# Patient Record
Sex: Female | Born: 1995
Health system: Southern US, Community
[De-identification: ages and names within clinical notes are randomized; demographics above are authoritative.]

## PROBLEM LIST (undated history)

## (undated) DIAGNOSIS — F329 Major depressive disorder, single episode, unspecified: Secondary | ICD-10-CM

## (undated) DIAGNOSIS — F419 Anxiety disorder, unspecified: Secondary | ICD-10-CM

## (undated) DIAGNOSIS — G43909 Migraine, unspecified, not intractable, without status migrainosus: Secondary | ICD-10-CM

## (undated) DIAGNOSIS — F909 Attention-deficit hyperactivity disorder, unspecified type: Secondary | ICD-10-CM

## (undated) DIAGNOSIS — F32A Depression, unspecified: Secondary | ICD-10-CM

## (undated) HISTORY — DX: Attention-deficit hyperactivity disorder, unspecified type: F90.9

## (undated) HISTORY — DX: Depression, unspecified: F32.A

## (undated) HISTORY — DX: Anxiety disorder, unspecified: F41.9

## (undated) HISTORY — DX: Migraine, unspecified, not intractable, without status migrainosus: G43.909

## (undated) HISTORY — DX: Major depressive disorder, single episode, unspecified: F32.9

---
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
## 2015-10-29 ENCOUNTER — Ambulatory Visit: Payer: No Typology Code available for payment source | Admitting: Adult Health

## 2015-12-13 ENCOUNTER — Encounter: Payer: Self-pay | Admitting: Adult Health

## 2015-12-13 ENCOUNTER — Ambulatory Visit (INDEPENDENT_AMBULATORY_CARE_PROVIDER_SITE_OTHER): Payer: 59 | Admitting: Adult Health

## 2015-12-13 VITALS — BP 130/90 | HR 76 | Temp 98.6°F | Ht 63.98 in | Wt 150.1 lb

## 2015-12-13 DIAGNOSIS — F329 Major depressive disorder, single episode, unspecified: Secondary | ICD-10-CM | POA: Diagnosis not present

## 2015-12-13 DIAGNOSIS — G43909 Migraine, unspecified, not intractable, without status migrainosus: Secondary | ICD-10-CM | POA: Insufficient documentation

## 2015-12-13 DIAGNOSIS — Z23 Encounter for immunization: Secondary | ICD-10-CM | POA: Diagnosis not present

## 2015-12-13 DIAGNOSIS — F32A Depression, unspecified: Secondary | ICD-10-CM

## 2015-12-13 DIAGNOSIS — Z7189 Other specified counseling: Secondary | ICD-10-CM | POA: Diagnosis not present

## 2015-12-13 DIAGNOSIS — H6123 Impacted cerumen, bilateral: Secondary | ICD-10-CM

## 2015-12-13 DIAGNOSIS — R030 Elevated blood-pressure reading, without diagnosis of hypertension: Secondary | ICD-10-CM

## 2015-12-13 DIAGNOSIS — Z7689 Persons encountering health services in other specified circumstances: Secondary | ICD-10-CM

## 2015-12-13 DIAGNOSIS — IMO0001 Reserved for inherently not codable concepts without codable children: Secondary | ICD-10-CM | POA: Insufficient documentation

## 2015-12-13 MED ORDER — CITALOPRAM HYDROBROMIDE 20 MG PO TABS: 20.0000 mg | ORAL_TABLET | Freq: Every day | ORAL | Status: DC

## 2015-12-13 NOTE — Patient Instructions (Addendum)
It was great meeting you today!  I have sent in a prescription for Celexa 20 mg. Take this daily for depression.   Make an appointment with GYN   Follow up with me in 4-6 weeks.   Please let me know if you need anything.   Health Maintenance, Female Adopting a healthy lifestyle and getting preventive care can go a long way to promote health and wellness. Talk with your health care provider about what schedule of regular examinations is right for you. This is a good chance for you to check in with your provider about disease prevention and staying healthy. In between checkups, there are plenty of things you can do on your own. Experts have done a lot of research about which lifestyle changes and preventive measures are most likely to keep you healthy. Ask your health care provider for more information. WEIGHT AND DIET  Eat a healthy diet  Be sure to include plenty of vegetables, fruits, low-fat dairy products, and lean protein.  Do not eat a lot of foods high in solid fats, added sugars, or salt.  Get regular exercise. This is one of the most important things you can do for your health.  Most adults should exercise for at least 150 minutes each week. The exercise should increase your heart rate and make you sweat (moderate-intensity exercise).  Most adults should also do strengthening exercises at least twice a week. This is in addition to the moderate-intensity exercise.  Maintain a healthy weight  Body mass index (BMI) is a measurement that can be used to identify possible weight problems. It estimates body fat based on height and weight. Your health care provider can help determine your BMI and help you achieve or maintain a healthy weight.  For females 28 years of age and older:   A BMI below 18.5 is considered underweight.  A BMI of 18.5 to 24.9 is normal.  A BMI of 25 to 29.9 is considered overweight.  A BMI of 30 and above is considered obese.  Watch levels of  cholesterol and blood lipids  You should start having your blood tested for lipids and cholesterol at 20 years of age, then have this test every 5 years.  You may need to have your cholesterol levels checked more often if:  Your lipid or cholesterol levels are high.  You are older than 20 years of age.  You are at high risk for heart disease.  CANCER SCREENING   Lung Cancer  Lung cancer screening is recommended for adults 44-30 years old who are at high risk for lung cancer because of a history of smoking.  A yearly low-dose CT scan of the lungs is recommended for people who:  Currently smoke.  Have quit within the past 15 years.  Have at least a 30-pack-year history of smoking. A pack year is smoking an average of one pack of cigarettes a day for 1 year.  Yearly screening should continue until it has been 15 years since you quit.  Yearly screening should stop if you develop a health problem that would prevent you from having lung cancer treatment.  Breast Cancer  Practice breast self-awareness. This means understanding how your breasts normally appear and feel.  It also means doing regular breast self-exams. Let your health care provider know about any changes, no matter how small.  If you are in your 20s or 30s, you should have a clinical breast exam (CBE) by a health care provider every 1-3 years  as part of a regular health exam.  If you are 59 or older, have a CBE every year. Also consider having a breast X-ray (mammogram) every year.  If you have a family history of breast cancer, talk to your health care provider about genetic screening.  If you are at high risk for breast cancer, talk to your health care provider about having an MRI and a mammogram every year.  Breast cancer gene (BRCA) assessment is recommended for women who have family members with BRCA-related cancers. BRCA-related cancers include:  Breast.  Ovarian.  Tubal.  Peritoneal  cancers.  Results of the assessment will determine the need for genetic counseling and BRCA1 and BRCA2 testing. Cervical Cancer Your health care provider may recommend that you be screened regularly for cancer of the pelvic organs (ovaries, uterus, and vagina). This screening involves a pelvic examination, including checking for microscopic changes to the surface of your cervix (Pap test). You may be encouraged to have this screening done every 3 years, beginning at age 41.  For women ages 13-65, health care providers may recommend pelvic exams and Pap testing every 3 years, or they may recommend the Pap and pelvic exam, combined with testing for human papilloma virus (HPV), every 5 years. Some types of HPV increase your risk of cervical cancer. Testing for HPV may also be done on women of any age with unclear Pap test results.  Other health care providers may not recommend any screening for nonpregnant women who are considered low risk for pelvic cancer and who do not have symptoms. Ask your health care provider if a screening pelvic exam is right for you.  If you have had past treatment for cervical cancer or a condition that could lead to cancer, you need Pap tests and screening for cancer for at least 20 years after your treatment. If Pap tests have been discontinued, your risk factors (such as having a new sexual partner) need to be reassessed to determine if screening should resume. Some women have medical problems that increase the chance of getting cervical cancer. In these cases, your health care provider may recommend more frequent screening and Pap tests. Colorectal Cancer  This type of cancer can be detected and often prevented.  Routine colorectal cancer screening usually begins at 20 years of age and continues through 20 years of age.  Your health care provider may recommend screening at an earlier age if you have risk factors for colon cancer.  Your health care provider may also  recommend using home test kits to check for hidden blood in the stool.  A small camera at the end of a tube can be used to examine your colon directly (sigmoidoscopy or colonoscopy). This is done to check for the earliest forms of colorectal cancer.  Routine screening usually begins at age 9.  Direct examination of the colon should be repeated every 5-10 years through 20 years of age. However, you may need to be screened more often if early forms of precancerous polyps or small growths are found. Skin Cancer  Check your skin from head to toe regularly.  Tell your health care provider about any new moles or changes in moles, especially if there is a change in a mole's shape or color.  Also tell your health care provider if you have a mole that is larger than the size of a pencil eraser.  Always use sunscreen. Apply sunscreen liberally and repeatedly throughout the [place].  Protect yourself by wearing long sleeves,  pants, a wide-brimmed hat, and sunglasses whenever you are outside. HEART DISEASE, DIABETES, AND HIGH BLOOD PRESSURE   High blood pressure causes heart disease and increases the risk of stroke. High blood pressure is more likely to develop in:  People who have blood pressure in the high end of the normal range (130-139/85-89 mm Hg).  People who are overweight or obese.  People who are African American.  If you are 22-60 years of age, have your blood pressure checked every 3-5 years. If you are 61 years of age or older, have your blood pressure checked every year. You should have your blood pressure measured twice--once when you are at a hospital or clinic, and once when you are not at a hospital or clinic. Record the average of the two measurements. To check your blood pressure when you are not at a hospital or clinic, you can use:  An automated blood pressure machine at a pharmacy.  A home blood pressure monitor.  If you are between 29 years and 55 years old, ask your health  care provider if you should take aspirin to prevent strokes.  Have regular diabetes screenings. This involves taking a blood sample to check your fasting blood sugar level.  If you are at a normal weight and have a low risk for diabetes, have this test once every three years after 20 years of age.  If you are overweight and have a high risk for diabetes, consider being tested at a younger age or more often. PREVENTING INFECTION  Hepatitis B  If you have a higher risk for hepatitis B, you should be screened for this virus. You are considered at high risk for hepatitis B if:  You were born in a country where hepatitis B is common. Ask your health care provider which countries are considered high risk.  Your parents were born in a high-risk country, and you have not been immunized against hepatitis B (hepatitis B vaccine).  You have HIV or AIDS.  You use needles to inject street drugs.  You live with someone who has hepatitis B.  You have had sex with someone who has hepatitis B.  You get hemodialysis treatment.  You take certain medicines for conditions, including cancer, organ transplantation, and autoimmune conditions. Hepatitis C  Blood testing is recommended for:  Everyone born from [birth year] through 1965.  Anyone with known risk factors for hepatitis C. Sexually transmitted infections (STIs)  You should be screened for sexually transmitted infections (STIs) including gonorrhea and chlamydia if:  You are sexually active and are younger than 20 years of age.  You are older than 20 years of age and your health care provider tells you that you are at risk for this type of infection.  Your sexual activity has changed since you were last screened and you are at an increased risk for chlamydia or gonorrhea. Ask your health care provider if you are at risk.  If you do not have HIV, but are at risk, it may be recommended that you take a prescription medicine daily to prevent HIV  infection. This is called pre-exposure prophylaxis (PrEP). You are considered at risk if:  You are sexually active and do not regularly use condoms or know the HIV status of your partner(s).  You take drugs by injection.  You are sexually active with a partner who has HIV. Talk with your health care provider about whether you are at high risk of being infected with HIV. If you choose to  begin PrEP, you should first be tested for HIV. You should then be tested every 3 months for as long as you are taking PrEP.  PREGNANCY   If you are premenopausal and you may become pregnant, ask your health care provider about preconception counseling.  If you may become pregnant, take 400 to 800 micrograms (mcg) of folic acid every day.  If you want to prevent pregnancy, talk to your health care provider about birth control (contraception). OSTEOPOROSIS AND MENOPAUSE   Osteoporosis is a disease in which the bones lose minerals and strength with aging. This can result in serious bone fractures. Your risk for osteoporosis can be identified using a bone density scan.  If you are 10 years of age or older, or if you are at risk for osteoporosis and fractures, ask your health care provider if you should be screened.  Ask your health care provider whether you should take a calcium or vitamin D supplement to lower your risk for osteoporosis.  Menopause may have certain physical symptoms and risks.  Hormone replacement therapy may reduce some of these symptoms and risks. Talk to your health care provider about whether hormone replacement therapy is right for you.  HOME CARE INSTRUCTIONS   Schedule regular health, dental, and eye exams.  Stay current with your immunizations.   Do not use any tobacco products including cigarettes, chewing tobacco, or electronic cigarettes.  If you are pregnant, do not drink alcohol.  If you are breastfeeding, limit how much and how often you drink alcohol.  Limit  alcohol intake to no more than 1 drink per day for nonpregnant women. One drink equals 12 ounces of beer, 5 ounces of wine, or 1 ounces of hard liquor.  Do not use street drugs.  Do not share needles.  Ask your health care provider for help if you need support or information about quitting drugs.  Tell your health care provider if you often feel depressed.  Tell your health care provider if you have ever been abused or do not feel safe at home.   This information is not intended to replace advice given to you by your health care provider. Make sure you discuss any questions you have with your health care provider.   Document Released: 05/04/2011 Document Revised: 11/09/2014 Document Reviewed: 09/20/2013 Elsevier Interactive Patient Education [employer].

## 2015-12-13 NOTE — Progress Notes (Signed)
Pre visit review using our clinic review tool, if applicable. No additional management support is needed unless otherwise documented below in the visit note. 

## 2015-12-13 NOTE — Progress Notes (Signed)
Patient presents to clinic today to establish care. She is a very pleasant 20 year old AA female. She  has a past medical history of Depression; Migraines; and ADHD (attention deficit hyperactivity disorder).   Acute Concerns: - Decreased sexual sensation. She is sexually active and not on birth control. She reports that recently she has noticed that when she has sex, she has no sensation besides penetration.   Chronic Issues:  Depression - This has been a chronic issue for many years. She feels as though when her parents were seperated in 2015 that her depression became worse. She has had thoughts of hurting  Herself in the past but has never acted on it and does not have a plan. She has never taken medication for depression.   Migraine - Has two migraines a week on average. Takes ibuprofen which sometimes helps take care of the headache and other times the headache just comes back.   Health Maintenance: Dental -- Twice a year  Vision -- Early Immunizations --UTD PAP -- Never had Diet - Does not eat healthy Exercise - Does not exercise.   Past Medical History  Diagnosis Date  . Depression   . Frequent headaches   . Migraines     No past surgical history on file.  No current outpatient prescriptions on file prior to visit.   No current facility-administered medications on file prior to visit.    Allergies  Allergen Reactions  . Penicillins Hives    No family history on file.  Social History   Social History  . Marital Status: Single    Spouse Name: N/A  . Number of Children: N/A  . Years of Education: N/A   Occupational History  . Not on file.   Social History Main Topics  . Smoking status: Current Every Day Smoker    Start date: 10/11/2014  . Smokeless tobacco: Not on file  . Alcohol Use: 0.6 - 1.8 oz/week    1-3 Standard drinks or equivalent per week  . Drug Use: No  . Sexual Activity: No   Other Topics Concern  . Not on file   Social History  Narrative    Review of Systems  Constitutional: Negative.   Eyes: Negative.   Respiratory: Negative.   Cardiovascular: Negative.   Genitourinary: Negative.   Musculoskeletal: Negative.   Skin: Negative.   Neurological: Positive for headaches.  Endo/Heme/Allergies: Negative.   Psychiatric/Behavioral: Positive for depression. Negative for suicidal ideas and substance abuse. The patient does not have insomnia.   All other systems reviewed and are negative.   BP 130/90 mmHg  Pulse 76  Temp(Src) 98.6 F (37 C) (Oral)  Ht 5' 3.98" (1.625 m)  Wt 150 lb 1.6 oz (68.085 kg)  BMI 25.78 kg/m2  SpO2 99%  Physical Exam  Constitutional: She is oriented to person, place, and time and well-developed, well-nourished, and in no distress. No distress.  HENT:  Head: Normocephalic and atraumatic.  Right Ear: External ear normal.  Left Ear: External ear normal.  Nose: Nose normal.  Mouth/Throat: Oropharynx is clear and moist. No oropharyngeal exudate.  Cerumen impaction in bilateral ears  Eyes: Conjunctivae and EOM are normal. Pupils are equal, round, and reactive to light. Right eye exhibits no discharge. Left eye exhibits no discharge. No scleral icterus.  Neck: Normal range of motion. Neck supple. No tracheal deviation present. No thyromegaly present.  Cardiovascular: Normal rate, regular rhythm, normal heart sounds and intact distal pulses.  Exam reveals no gallop  and no friction rub.   No murmur heard. Pulmonary/Chest: Effort normal and breath sounds normal. No stridor. No respiratory distress. She has no wheezes. She has no rales. She exhibits no tenderness.  Abdominal: Soft. Bowel sounds are normal. She exhibits no distension and no mass. There is no tenderness. There is no rebound and no guarding.  Musculoskeletal: Normal range of motion. She exhibits no edema or tenderness.  Lymphadenopathy:    She has no cervical adenopathy.  Neurological: She is alert and oriented to person, place,  and time. She displays normal reflexes. No cranial nerve deficit. She exhibits normal muscle tone. Gait normal. Coordination normal. GCS score is 15.  Skin: Skin is warm and dry. No rash noted. She is not diaphoretic. No erythema. No pallor.  Psychiatric: Mood, memory, affect and judgment normal.  Nursing note and vitals reviewed.   No results found for this or any previous visit (from the past 2160 hour(s)).  Assessment/Plan: 1. Encounter to establish care - Follow up for physical  - Follow up sooner if needed - She needs to start exercising and eating healthy - Will continue to monitor blood pressure - She will follow up with GYN  2. Needs flu shot - Flu Vaccine QUAD 36+ mos PF IM (Fluarix & Fluzone Quad PF)  3. Depression - Advised to go to the ER with any thoughts of SI or self harm - citalopram (CELEXA) 20 MG tablet; Take 1 tablet (20 mg total) by mouth daily.  Dispense: 30 tablet; Refill: 3 - Follow up in 4-6 weeks.   4. Cerumen impaction, bilateral - Cerumen able to be disimpacted with irrigation. No signs of infection.

## 2016-03-20 ENCOUNTER — Encounter: Payer: Self-pay | Admitting: Certified Nurse Midwife

## 2016-03-26 ENCOUNTER — Encounter: Payer: Self-pay | Admitting: Certified Nurse Midwife

## 2016-03-26 ENCOUNTER — Ambulatory Visit (INDEPENDENT_AMBULATORY_CARE_PROVIDER_SITE_OTHER): Payer: 59 | Admitting: Certified Nurse Midwife

## 2016-03-26 VITALS — BP 110/68 | HR 72 | Resp 16 | Ht 64.75 in | Wt 144.0 lb

## 2016-03-26 DIAGNOSIS — Z0000 Encounter for general adult medical examination without abnormal findings: Secondary | ICD-10-CM

## 2016-03-26 DIAGNOSIS — Z01419 Encounter for gynecological examination (general) (routine) without abnormal findings: Secondary | ICD-10-CM

## 2016-03-26 DIAGNOSIS — N898 Other specified noninflammatory disorders of vagina: Secondary | ICD-10-CM

## 2016-03-26 DIAGNOSIS — Z30011 Encounter for initial prescription of contraceptive pills: Secondary | ICD-10-CM

## 2016-03-26 LAB — POCT URINALYSIS DIPSTICK
BILIRUBIN UA: NEGATIVE
GLUCOSE UA: NEGATIVE
KETONES UA: NEGATIVE
Leukocytes, UA: NEGATIVE
NITRITE UA: NEGATIVE
PH UA: 5
Protein, UA: NEGATIVE
RBC UA: NEGATIVE
Urobilinogen, UA: NEGATIVE

## 2016-03-26 MED ORDER — NORETHIN ACE-ETH ESTRAD-FE 1-20 MG-MCG(24) PO TABS: 1.0000 | ORAL_TABLET | Freq: Every day | ORAL | Status: DC

## 2016-03-26 NOTE — Progress Notes (Signed)
20 y.o. G0P0000 Single  African American Fe here to establish gyn care and for annual exam. Partner with patient. Patient gave verbal consent for conversation and management. Patient does not want partner in for exam.  Periods regular, with 7 day duration, minimal cramping, heavy day 1-2 only. No problems with periods. Patient concerned she can not feel penetration or obtain orgasm. Please check to see if problem. Desires STD screening. Patient also wants to start on OCP. No other health issues today.  Patient's last menstrual period was 03/11/2016.          Sexually active: Yes.    The current method of family planning is none.    Exercising: No.  exercise Smoker:  no  Health Maintenance: Pap:  none MMG:  none Colonoscopy:  none BMD:   none TDaP: maybe age 20 Shingles: no Pneumonia: no Hep C and HIV: not done Labs: poct urine-neg, hgb-13.0 Self breast exam: not done   reports that she has been smoking E-cigarettes.  She started smoking about 17 months ago. She does not have any smokeless tobacco history on file. She reports that she drinks alcohol. She reports that she does not use illicit drugs.  Past Medical History  Diagnosis Date  . Depression   . Migraines   . ADHD (attention deficit hyperactivity disorder)   . Anxiety     History reviewed. No pertinent past surgical history.  Current Outpatient Prescriptions  Medication Sig Dispense Refill  . citalopram (CELEXA) 20 MG tablet Take 1 tablet (20 mg total) by mouth daily. 30 tablet 3   No current facility-administered medications for this visit.    Family History  Problem Relation Age of Onset  . Hypertension Mother   . Hypertension Father   . Hypertension Maternal Grandmother   . Hypertension Maternal Grandfather   . Depression Maternal Grandmother   . Lung cancer Other     Grandparent unknown  . Ovarian cancer Other     Grandparent unknown  . Diabetes Maternal Grandmother     ROS:  Pertinent items are noted  in HPI.  Otherwise, a comprehensive ROS was negative.  Exam:   BP 110/68 mmHg  Pulse 72  Resp 16  Ht 5' 4.75" (1.645 m)  Wt 144 lb (65.318 kg)  BMI 24.14 kg/m2  LMP 03/11/2016 Height: 5' 4.75" (164.5 cm) Ht Readings from Last 3 Encounters:  03/26/16 5' 4.75" (1.645 m)  12/13/15 5' 3.98" (1.625 m) (45 %*, Z = -0.13)   * Growth percentiles are based on CDC 2-20 Years data.    General appearance: alert, cooperative and appears stated age Head: Normocephalic, without obvious abnormality, atraumatic Neck: no adenopathy, supple, symmetrical, trachea midline and thyroid normal to inspection and palpation Lungs: clear to auscultation bilaterally Breasts: normal appearance, no masses or tenderness, No nipple retraction or dimpling, No nipple discharge or bleeding, No axillary or supraclavicular adenopathy, Taught monthly breast self examination Heart: regular rate and rhythm Abdomen: soft, non-tender; no masses,  no organomegaly Extremities: extremities normal, atraumatic, no cyanosis or edema Skin: Skin color, texture, turgor normal. No rashes or lesions Lymph nodes: Cervical, supraclavicular, and axillary nodes normal. No abnormal inguinal nodes palpated Neurologic: Grossly normal   Pelvic: External genitalia:  no lesions,normal female labia majora on right slight longer skin than left, patient aware and no issues with.. Clitoral area visualized normal appearance and normal response with touch, patient acknowledges she can feel this and also has orgasm with self stimulation  Urethra:  normal appearing urethra with no masses, tenderness or lesions              Bartholin's and Skene's: normal                 Vagina: normal appearing vagina with normal color and discharge, no lesions              Cervix: anteverted, no cervical motion tenderness, no lesions and nulliparous appearance              Pap taken: No. Bimanual Exam:  Uterus:  normal size, contour, position,  consistency, mobility, non-tender and retroflexed with uterine exam patient feels same pressure as with sexual activity              Adnexa: normal adnexa and no mass, fullness, tenderness               Rectovaginal: Confirms               Anus:  normal appearance  Chaperone present: yes  A:  Well Woman with normal exam  Contraception desired OCP  Decrease sensation with sexual activity in vagina, no orgasm with partner  Gardasil candidate  Screening labs  Smoker e cig with nicotine  P:   Reviewed health and wellness pertinent to exam  Discussed risks, benefits, warning signs,side effects and expectation with OCP use. Discussed importance of regular approximately same time of day use of pill for most effectiveness and less problems. Questions addressed at length with partner present. Needs to return for evaluation after 3  Months of use. Patient agreeable.  Rx Loestrin 24 Fe see order, with printed instructions given.  Discussed with patient findings during exam that she has normal response with clitoris and vagina. Partner large and she feels this is why she feels pressure. Discussed uterine position is contributing to this pressure and change of position to side entry to see if it will help.Use of lubricate Astroglide she help also. Patient will advise if continues to be a problem. Also discussed over stimulation of clitoris will become painful.  Discussed risks and benefits of Gardasil. Given handout. Patient will decide and call for appt.  Labs: HIV,RPR, GC,Chlamydia, Affirm  Encouraged to reduce smoking to reduce risk of use with OCP. Patient is working on.  Pap smear as above not taken at age 63   counseled on breast self exam, STD prevention, HIV risk factors and prevention, use and side effects of OCP's, adequate intake of calcium and vitamin D, diet and exercise  return annually or prn  An After Visit Summary was printed and given to the patient.

## 2016-03-26 NOTE — Progress Notes (Signed)
Reviewed personally.  M. Suzanne Altha Sweitzer, MD.  

## 2016-03-26 NOTE — Patient Instructions (Addendum)
General topics  Next pap or exam is  due in 1 year Take a Women's multivitamin Take 1200 mg. of calcium daily - prefer dietary If any concerns in interim to call back  Breast Self-Awareness Practicing breast self-awareness may pick up problems early, prevent significant medical complications, and possibly save your life. By practicing breast self-awareness, you can become familiar with how your breasts look and feel and if your breasts are changing. This allows you to notice changes early. It can also offer you some reassurance that your breast health is good. One way to learn what is normal for your breasts and whether your breasts are changing is to do a breast self-exam. If you find a lump or something that was not present in the past, it is best to contact your caregiver right away. Other findings that should be evaluated by your caregiver include nipple discharge, especially if it is bloody; skin changes or reddening; areas where the skin seems to be pulled in (retracted); or new lumps and bumps. Breast pain is seldom associated with cancer (malignancy), but should also be evaluated by a caregiver. BREAST SELF-EXAM The best time to examine your breasts is 5 7 days after your menstrual period is over.  ExitCare Patient Information 2013 ExitCare[employer] LLC.   Exercise to Stay Healthy Exercise helps you become and stay healthy. EXERCISE IDEAS AND TIPS Choose exercises that:  You enjoy.  Fit into your day. You do not need to exercise really hard to be healthy. You can do exercises at a slow or medium level and stay healthy. You can:  Stretch before and after working out.  Try yoga, Pilates, or tai chi.  Lift weights.  Walk fast, swim, jog, run, climb stairs, bicycle, dance, or rollerskate.  Take aerobic classes. Exercises that burn about 150 calories:  Running 1  miles in 15 minutes.  Playing volleyball for 45 to 60 minutes.  Washing and waxing a car for 45 to 60  minutes.  Playing touch football for 45 minutes.  Walking 1  miles in 35 minutes.  Pushing a stroller 1  miles in 30 minutes.  Playing basketball for 30 minutes.  Raking leaves for 30 minutes.  Bicycling 5 miles in 30 minutes.  Walking 2 miles in 30 minutes.  Dancing for 30 minutes.  Shoveling snow for 15 minutes.  Swimming laps for 20 minutes.  Walking up stairs for 15 minutes.  Bicycling 4 miles in 15 minutes.  Gardening for 30 to 45 minutes.  Jumping rope for 15 minutes.  Washing windows or floors for 45 to 60 minutes. Document Released: 11/21/2010 Document Revised: 01/11/2012 Document Reviewed: 11/21/2010 ExitCare Patient Information 2013 ExitCare[employer] LLC.   Other topics ( that may be useful information):    Sexually Transmitted Disease Sexually transmitted disease (STD) refers to any infection that is passed from person to person during sexual activity. This may happen by way of saliva, semen, blood, vaginal mucus, or urine. Common STDs include:  Gonorrhea.  Chlamydia.  Syphilis.  HIV/AIDS.  Genital herpes.  Hepatitis B and C.  Trichomonas.  Human papillomavirus (HPV).  Pubic lice. CAUSES  An STD may be spread by bacteria, virus, or parasite. A person can get an STD by:  Sexual intercourse with an infected person.  Sharing sex toys with an infected person.  Sharing needles with an infected person.  Having intimate contact with the genitals, mouth, or rectal areas of an infected person. SYMPTOMS  Some people may not have any symptoms, but   they can still pass the infection to others. Different STDs have different symptoms. Symptoms include:  Painful or bloody urination.  Pain in the pelvis, abdomen, vagina, anus, throat, or eyes.  Skin rash, itching, irritation, growths, or sores (lesions). These usually occur in the genital or anal area.  Abnormal vaginal discharge.  Penile discharge in men.  Soft, flesh-colored skin growths in the  genital or anal area.  Fever.  Pain or bleeding during sexual intercourse.  Swollen glands in the groin area.  Yellow skin and eyes (jaundice). This is seen with hepatitis. DIAGNOSIS  To make a diagnosis, your caregiver may:  Take a medical history.  Perform a physical exam.  Take a specimen (culture) to be examined.  Examine a sample of discharge under a microscope.  Perform blood test TREATMENT   Chlamydia, gonorrhea, trichomonas, and syphilis can be cured with antibiotic medicine.  Genital herpes, hepatitis, and HIV can be treated, but not cured, with prescribed medicines. The medicines will lessen the symptoms.  Genital warts from HPV can be treated with medicine or by freezing, burning (electrocautery), or surgery. Warts may come back.  HPV is a virus and cannot be cured with medicine or surgery.However, abnormal areas may be followed very closely by your caregiver and may be removed from the cervix, vagina, or vulva through office procedures or surgery. If your diagnosis is confirmed, your recent sexual partners need treatment. This is true even if they are symptom-free or have a negative culture or evaluation. They should not have sex until their caregiver says it is okay. HOME CARE INSTRUCTIONS  All sexual partners should be informed, tested, and treated for all STDs.  Take your antibiotics as directed. Finish them even if you start to feel better.  Only take over-the-counter or prescription medicines for pain, discomfort, or fever as directed by your caregiver.  Rest.  Eat a balanced diet and drink enough fluids to keep your urine clear or pale yellow.  Do not have sex until treatment is completed and you have followed up with your caregiver. STDs should be checked after treatment.  Keep all follow-up appointments, Pap tests, and blood tests as directed by your caregiver.  Only use latex condoms and water-soluble lubricants during sexual activity. Do not use  petroleum jelly or oils.  Avoid alcohol and illegal drugs.  Get vaccinated for HPV and hepatitis. If you have not received these vaccines in the past, talk to your caregiver about whether one or both might be right for you.  Avoid risky sex practices that can break the skin. The only way to avoid getting an STD is to avoid all sexual activity.Latex condoms and dental dams (for oral sex) will help lessen the risk of getting an STD, but will not completely eliminate the risk. SEEK MEDICAL CARE IF:   You have a fever.  You have any new or worsening symptoms. Document Released: 01/09/2003 Document Revised: 01/11/2012 Document Reviewed: 01/16/2011 [facility] Patient Information 2013 [place].    Domestic Abuse You are being battered or abused if someone close to you hits, pushes, or physically hurts you in any way. You also are being abused if you are forced into activities. You are being sexually abused if you are forced to have sexual contact of any kind. You are being emotionally abused if you are made to feel worthless or if you are constantly threatened. It is important to remember that help is available. No one has the right to abuse you. PREVENTION OF FURTHER  ABUSE  Learn the warning signs of danger. This varies with situations but may include: the use of alcohol, threats, isolation from friends and family, or forced sexual contact. Leave if you feel that violence is going to occur.  If you are attacked or beaten, report it to the police so the abuse is documented. You do not have to press charges. The police can protect you while you or the attackers are leaving. Get the officer's name and badge number and a copy of the report.  Find someone you can trust and tell them what is happening to you: your caregiver, a nurse, clergy member, close friend or family member. Feeling ashamed is natural, but remember that you have done nothing wrong. No one deserves abuse. Document Released:  10/16/2000 Document Revised: 01/11/2012 Document Reviewed: 12/25/2010 ExitCare Patient Information 2013 ExitCare[employer] LLC.    How Much is Too Much Alcohol? Drinking too much alcohol can cause injury, accidents, and health problems. These types of problems can include:   Car crashes.  Falls.  Family fighting (domestic violence).  Drowning.  Fights.  Injuries.  Burns.  Damage to certain organs.  Having a baby with birth defects. ONE DRINK CAN BE TOO MUCH WHEN YOU ARE:  Working.  Pregnant or breastfeeding.  Taking medicines. Ask your doctor.  Driving or planning to drive. If you or someone you know has a drinking problem, get help from a doctor.  Document Released: 08/15/2009 Document Revised: 01/11/2012 Document Reviewed: 08/15/2009 ExitCare Patient Information 2013 ExitCare, LLC.   Smoking Hazards Smoking cigarettes is extremely bad for your health. Tobacco smoke has over 200 known poisons in it. There are over 60 chemicals in tobacco smoke that cause cancer. Some of the chemicals found in cigarette smoke include:   Cyanide.  Benzene.  Formaldehyde.  Methanol (wood alcohol).  Acetylene (fuel used in welding torches).  Ammonia. Cigarette smoke also contains the poisonous gases nitrogen oxide and carbon monoxide.  Cigarette smokers have an increased risk of many serious medical problems and Smoking causes approximately:  90% of all lung cancer deaths in men.  80% of all lung cancer deaths in women.  90% of deaths from chronic obstructive lung disease. Compared with nonsmokers, smoking increases the risk of:  Coronary heart disease by 2 to 4 times.  Stroke by 2 to 4 times.  Men developing lung cancer by 23 times.  Women developing lung cancer by 13 times.  Dying from chronic obstructive lung diseases by 12 times.  . Smoking is the most preventable cause of death and disease in our society.  WHY IS SMOKING ADDICTIVE?  Nicotine is the chemical  agent in tobacco that is capable of causing addiction or dependence.  When you smoke and inhale, nicotine is absorbed rapidly into the bloodstream through your lungs. Nicotine absorbed through the lungs is capable of creating a powerful addiction. Both inhaled and non-inhaled nicotine may be addictive.  Addiction studies of cigarettes and spit tobacco show that addiction to nicotine occurs mainly during the teen years, when young people begin using tobacco products. WHAT ARE THE BENEFITS OF QUITTING?  There are many health benefits to quitting smoking.   Likelihood of developing cancer and heart disease decreases. Health improvements are seen almost immediately.  Blood pressure, pulse rate, and breathing patterns start returning to normal soon after quitting. QUITTING SMOKING   American Lung Association - 1-800-LUNGUSA  American Cancer Society - 1-800-ACS-2345 Document Released: 11/26/2004 Document Revised: 01/11/2012 Document Reviewed: 07/31/2009 ExitCare Patient Information 2013 ExitCare,   LLC.   Stress Management Stress is a state of physical or mental tension that often results from changes in your life or normal routine. Some common causes of stress are:  Death of a loved one.  Injuries or severe illnesses.  Getting fired or changing jobs.  Moving into a new home. Other causes may be:  Sexual problems.  Business or financial losses.  Taking on a large debt.  Regular conflict with someone at home or at work.  Constant tiredness from lack of sleep. It is not just bad things that are stressful. It may be stressful to:  Win the lottery.  Get married.  Buy a new car. The amount of stress that can be easily tolerated varies from person to person. Changes generally cause stress, regardless of the types of change. Too much stress can affect your health. It may lead to physical or emotional problems. Too little stress (boredom) may also become stressful. SUGGESTIONS TO  REDUCE STRESS:  Talk things over with your family and friends. It often is helpful to share your concerns and worries. If you feel your problem is serious, you may want to get help from a professional counselor.  Consider your problems one at a time instead of lumping them all together. Trying to take care of everything at once may seem impossible. List all the things you need to do and then start with the most important one. Set a goal to accomplish 2 or 3 things each day. If you expect to do too many in a single day you will naturally fail, causing you to feel even more stressed.  Do not use alcohol or drugs to relieve stress. Although you may feel better for a short time, they do not remove the problems that caused the stress. They can also be habit forming.  Exercise regularly - at least 3 times per week. Physical exercise can help to relieve that "uptight" feeling and will relax you.  The shortest distance between despair and hope is often a good night's sleep.  Go to bed and get up on time allowing yourself time for appointments without being rushed.  Take a short "time-out" period from any stressful situation that occurs during the day. Close your eyes and take some deep breaths. Starting with the muscles in your face, tense them, hold it for a few seconds, then relax. Repeat this with the muscles in your neck, shoulders, hand, stomach, back and legs.  Take good care of yourself. Eat a balanced diet and get plenty of rest.  Schedule time for having fun. Take a break from your daily routine to relax. HOME CARE INSTRUCTIONS   Call if you feel overwhelmed by your problems and feel you can no longer manage them on your own.  Return immediately if you feel like hurting yourself or someone else. Document Released: 04/14/2001 Document Revised: 01/11/2012 Document Reviewed: 12/05/2007 [facility] Patient Information 2013 [place], [place].  Oral contraceptionOral Contraception Use Oral  contraceptive pills (OCPs) are medicines taken to prevent pregnancy. OCPs work by preventing the ovaries from releasing eggs. The hormones in OCPs also cause the cervical mucus to thicken, preventing the sperm from entering the uterus. The hormones also cause the uterine lining to become thin, not allowing a fertilized egg to attach to the inside of the uterus. OCPs are highly effective when taken exactly as prescribed. However, OCPs do not prevent sexually transmitted diseases (STDs). Safe sex practices, such as using condoms along with an OCP, can help prevent STDs.  Before taking OCPs, you may have a physical exam and Pap test. Your health care provider may also order blood tests if necessary. Your health care provider will make sure you are a good candidate for oral contraception. Discuss with your health care provider the possible side effects of the OCP you may be prescribed. When starting an OCP, it can take 2 to 3 months for the body to adjust to the changes in hormone levels in your body.  HOW TO TAKE ORAL CONTRACEPTIVE PILLS Your health care provider may advise you on how to start taking the first cycle of OCPs. Otherwise, you can:   Start on day 1 of your menstrual period. You will not need any backup contraceptive protection with this start time.   Start on the first Sunday after your menstrual period or the day you get your prescription. In these cases, you will need to use backup contraceptive protection for the first week.   Start the pill at any time of your cycle. If you take the pill within 5 days of the start of your period, you are protected against pregnancy right away. In this case, you will not need a backup form of birth control. If you start at any other time of your menstrual cycle, you will need to use another form of birth control for 7 days. If your OCP is the type called a minipill, it will protect you from pregnancy after taking it for 2 days (48 hours). After you have  started taking OCPs:   If you forget to take 1 pill, take it as soon as you remember. Take the next pill at the regular time.   If you miss 2 or more pills, call your health care provider because different pills have different instructions for missed doses. Use backup birth control until your next menstrual period starts.   If you use a 28-day pack that contains inactive pills and you miss 1 of the last 7 pills (pills with no hormones), it will not matter. Throw away the rest of the non-hormone pills and start a new pill pack.  No matter which day you start the OCP, you will always start a new pack on that same day of the week. Have an extra pack of OCPs and a backup contraceptive method available in case you miss some pills or lose your OCP pack.  HOME CARE INSTRUCTIONS   Do not smoke.   Always use a condom to protect against STDs. OCPs do not protect against STDs.   Use a calendar to mark your menstrual period days.   Read the information and directions that came with your OCP. Talk to your health care provider if you have questions.  SEEK MEDICAL CARE IF:   You develop nausea and vomiting.   You have abnormal vaginal discharge or bleeding.   You develop a rash.   You miss your menstrual period.   You are losing your hair.   You need treatment for mood swings or depression.   You get dizzy when taking the OCP.   You develop acne from taking the OCP.   You become pregnant.  SEEK IMMEDIATE MEDICAL CARE IF:   You develop chest pain.   You develop shortness of breath.   You have an uncontrolled or severe headache.   You develop numbness or slurred speech.   You develop visual problems.   You develop pain, redness, and swelling in the legs.    This information is not intended  to replace advice given to you by your health care provider. Make sure you discuss any questions you have with your health care provider.   Document Released: 10/08/2011  Document Revised: 11/09/2014 Document Reviewed: 04/09/2013 Elsevier Interactive Patient Education 2016 [employer]. Oral Contraception Information Oral contraceptive pills (OCPs) are medicines taken to prevent pregnancy. OCPs work by preventing the ovaries from releasing eggs. The hormones in OCPs also cause the cervical mucus to thicken, preventing the sperm from entering the uterus. The hormones also cause the uterine lining to become thin, not allowing a fertilized egg to attach to the inside of the uterus. OCPs are highly effective when taken exactly as prescribed. However, OCPs do not prevent sexually transmitted diseases (STDs). Safe sex practices, such as using condoms along with the pill, can help prevent STDs.  Before taking the pill, you may have a physical exam and Pap test. Your health care provider may order blood tests. The health care provider will make sure you are a good candidate for oral contraception. Discuss with your health care provider the possible side effects of the OCP you may be prescribed. When starting an OCP, it can take 2 to 3 months for the body to adjust to the changes in hormone levels in your body.  TYPES OF ORAL CONTRACEPTION  The combination pill--This pill contains estrogen and progestin (synthetic progesterone) hormones. The combination pill comes in 21-day, 28-day, or 91-day packs. Some types of combination pills are meant to be taken continuously (365-day pills). With 21-day packs, you do not take pills for 7 days after the last pill. With 28-day packs, the pill is taken every day. The last 7 pills are without hormones. Certain types of pills have more than 21 hormone-containing pills. With 91-day packs, the first 84 pills contain both hormones, and the last 7 pills contain no hormones or contain estrogen only.  The minipill--This pill contains the progesterone hormone only. The pill is taken every day continuously. It is very important to take the pill at the  same time each day. The minipill comes in packs of 28 pills. All 28 pills contain the hormone.  ADVANTAGES OF ORAL CONTRACEPTIVE PILLS  Decreases premenstrual symptoms.   Treats menstrual period cramps.   Regulates the menstrual cycle.   Decreases a heavy menstrual flow.   May treatacne, depending on the type of pill.   Treats abnormal uterine bleeding.   Treats polycystic ovarian syndrome.   Treats endometriosis.   Can be used as emergency contraception.  THINGS THAT CAN MAKE ORAL CONTRACEPTIVE PILLS LESS EFFECTIVE OCPs can be less effective if:   You forget to take the pill at the same time every day.   You have a stomach or intestinal disease that lessens the absorption of the pill.   You take OCPs with other medicines that make OCPs less effective, such as antibiotics, certain HIV medicines, and some seizure medicines.   You take expired OCPs.   You forget to restart the pill on day 7, when using the packs of 21 pills.  RISKS ASSOCIATED WITH ORAL CONTRACEPTIVE PILLS  Oral contraceptive pills can sometimes cause side effects, such as:  Headache.  Nausea.  Breast tenderness.  Irregular bleeding or spotting. Combination pills are also associated with a small increased risk of:  Blood clots.  Heart attack.  Stroke.   This information is not intended to replace advice given to you by your health care provider. Make sure you discuss any questions you have with your health  care provider.   Document Released: 01/09/2003 Document Revised: 08/09/2013 Document Reviewed: 04/09/2013 Elsevier Interactive Patient Education [employer].

## 2016-03-27 ENCOUNTER — Telehealth: Payer: Self-pay

## 2016-03-27 ENCOUNTER — Other Ambulatory Visit: Payer: Self-pay | Admitting: Nurse Practitioner

## 2016-03-27 LAB — HIV ANTIBODY (ROUTINE TESTING W REFLEX): HIV 1&2 Ab, 4th Generation: NONREACTIVE

## 2016-03-27 LAB — WET PREP BY MOLECULAR PROBE
CANDIDA SPECIES: NEGATIVE
Gardnerella vaginalis: POSITIVE — AB
Trichomonas vaginosis: NEGATIVE

## 2016-03-27 LAB — RPR

## 2016-03-27 MED ORDER — METRONIDAZOLE 0.75 % VA GEL: 1.0000 | Freq: Every day | VAGINAL | Status: DC

## 2016-03-27 NOTE — Telephone Encounter (Signed)
-----   Message from Ria CommentPatricia Grubb, FNP sent at 03/27/2016 11:55 AM EDT ----- Please call pt and let her know that STS and HIV is negative.  The GC and Chlamydia is not back an she will be called later with those results.  The wet prep was negative for yeast but + BV.  Medication is sent to the pharmacy.

## 2016-03-27 NOTE — Progress Notes (Signed)
Patient notified of results as written by provider 

## 2016-03-27 NOTE — Telephone Encounter (Signed)
Patient notified of results. See lab 

## 2016-03-27 NOTE — Telephone Encounter (Signed)
lmtcb

## 2016-03-31 ENCOUNTER — Telehealth: Payer: Self-pay | Admitting: Certified Nurse Midwife

## 2016-03-31 NOTE — Telephone Encounter (Signed)
Patient would like to speak with nurse about changing birth control due to cost.

## 2016-04-01 LAB — IPS N GONORRHOEA AND CHLAMYDIA BY PCR

## 2016-04-01 NOTE — Telephone Encounter (Signed)
Attempted to reach patient at number provided [phone]. There was no answer and recording states that the voicemail box is full and not accepting messages at this time.

## 2016-04-01 NOTE — Telephone Encounter (Signed)
May change to Lomedia 24 if that is covered.

## 2016-04-01 NOTE — Telephone Encounter (Signed)
Cost of Loestrin 24 Fe is too expensive for patient. [facility] It appears that Lomedia 24 Fe, Gildess 24 Fe, and Junel Fe are covered at tier 1.  Routing to Ria CommentPatricia Grubb, FNP for review and advise.

## 2016-04-02 ENCOUNTER — Telehealth: Payer: Self-pay

## 2016-04-02 NOTE — Telephone Encounter (Signed)
-----   Message from Verner Choleborah S Leonard, CNM sent at 04/01/2016  7:18 PM EDT ----- Notify patient that Gc, Chlamydia were negative

## 2016-04-02 NOTE — Telephone Encounter (Signed)
Mailbox full. Try again. 

## 2016-04-03 MED ORDER — METRONIDAZOLE 500 MG PO TABS: 500.0000 mg | ORAL_TABLET | Freq: Two times a day (BID) | ORAL | Status: DC

## 2016-04-03 MED ORDER — NORETHIN ACE-ETH ESTRAD-FE 1-20 MG-MCG(24) PO TABS: 1.0000 | ORAL_TABLET | Freq: Every day | ORAL | Status: DC

## 2016-04-03 NOTE — Telephone Encounter (Signed)
Patient notified of results. See lab 

## 2016-04-03 NOTE — Telephone Encounter (Signed)
Attempted to reach patient at number provided [phone]. There was no answer and recording states that the voicemail box is full and not accepting messages at this time. 

## 2016-04-03 NOTE — Telephone Encounter (Signed)
Spoke with patient. Advised of message as seen below from Ria CommentPatricia Grubb, FNP. Patient is agreeable and verbalizes understanding. Rx for Lomedia 24 #1 3RF sent to pharmacy on file. Patient also states that her rx for Metrogel will be over $100. Asking for an alternative due to cost. Rx for Flagyl 500 mg bid x 7 days #13 0RF sent to pharmacy on file. ETOH precautions given. Patient verbalizes understanding.  Routing to provider for final review. Patient agreeable to disposition. Will close encounter.

## 2016-04-06 LAB — HEMOGLOBIN, FINGERSTICK: Hemoglobin, fingerstick: 13 g/dL (ref 12.0–16.0)

## 2016-04-09 ENCOUNTER — Telehealth: Payer: Self-pay | Admitting: Certified Nurse Midwife

## 2016-04-09 NOTE — Telephone Encounter (Signed)
Patient checked her pharmacy for the new prescription and it was the exact one that she was trying to change.

## 2016-04-09 NOTE — Telephone Encounter (Signed)
Spoke with patient. Patient states that Lomedia 24 is going to be the same cost as Loestrin 24. Advised patient she will need to contact her insurance company to verify coverage for alternative OCP options and return call to the office so that we may send in a new rx that will be covered .She is agreeable.

## 2016-04-23 NOTE — Telephone Encounter (Signed)
Left message following up on OCP RX (see message below) -eh

## 2016-05-19 ENCOUNTER — Telehealth: Payer: Self-pay | Admitting: Certified Nurse Midwife

## 2016-05-19 NOTE — Telephone Encounter (Signed)
Spoke with patient. Patient states that when she has intercourse "I feel nothing. I feel the in and out, but I do not have any other sensation." States she discussed this with Leota Sauerseborah Leonard CNM at her last OV on 03/24/2016. Reports she has tried different positions with no change. Denies using lubrication for intercourse. "That is not the problem." Reports she does not have an orgasm during intercourse. Asking what she can do to help with this. She is not currently on any form of birth control. She desires to start depo as discussed with Leota Sauerseborah Leonard CNM. Aware she will need to contact the office with the first day of her next menses to schedule. Advised I will speak with Leota Sauerseborah Leonard CNM and return call with further recommendations. She is agreeable.

## 2016-05-19 NOTE — Telephone Encounter (Signed)
Patient would like to speak with nurse regarding the same problems she had before.

## 2016-05-26 NOTE — Telephone Encounter (Signed)
Patient can be seen by sex therapist for discussion regarding this. Her physical exam was normal when seen.

## 2016-05-28 ENCOUNTER — Ambulatory Visit: Payer: 59 | Admitting: Obstetrics and Gynecology

## 2016-05-28 ENCOUNTER — Telehealth: Payer: Self-pay | Admitting: Certified Nurse Midwife

## 2016-05-28 NOTE — Telephone Encounter (Signed)
Call to patient. Offered options. Patient will come in for work in appointment this afternoon with Dr Oscar La. Will discuss issues and determine next step.  Encounter closed.   CC: Dr Oscar La

## 2016-06-01 NOTE — Telephone Encounter (Signed)
Patient canceled her appointment for prob with intercourse. She did not want to reschedule this appointment.

## 2016-06-02 ENCOUNTER — Ambulatory Visit: Payer: 59 | Admitting: Obstetrics and Gynecology

## 2016-06-26 ENCOUNTER — Telehealth: Payer: Self-pay | Admitting: Certified Nurse Midwife

## 2016-06-26 ENCOUNTER — Encounter: Payer: Self-pay | Admitting: Certified Nurse Midwife

## 2016-06-26 ENCOUNTER — Ambulatory Visit: Payer: 59 | Admitting: Certified Nurse Midwife

## 2016-06-26 NOTE — Telephone Encounter (Signed)
Left message on voicemail to call and reschedule appointment from this morning if she would like.

## 2017-04-08 ENCOUNTER — Encounter: Payer: Self-pay | Admitting: Certified Nurse Midwife

## 2018-03-09 ENCOUNTER — Other Ambulatory Visit: Payer: Self-pay

## 2018-03-09 ENCOUNTER — Ambulatory Visit (INDEPENDENT_AMBULATORY_CARE_PROVIDER_SITE_OTHER): Payer: 59 | Admitting: Family Medicine

## 2018-03-09 ENCOUNTER — Encounter: Payer: Self-pay | Admitting: Family Medicine

## 2018-03-09 VITALS — BP 140/100 | HR 92 | Temp 98.5°F | Ht 65.4 in | Wt 153.8 lb

## 2018-03-09 DIAGNOSIS — R030 Elevated blood-pressure reading, without diagnosis of hypertension: Secondary | ICD-10-CM | POA: Diagnosis not present

## 2018-03-09 DIAGNOSIS — R3129 Other microscopic hematuria: Secondary | ICD-10-CM

## 2018-03-09 DIAGNOSIS — R109 Unspecified abdominal pain: Secondary | ICD-10-CM

## 2018-03-09 LAB — POCT URINALYSIS DIP (MANUAL ENTRY)
Bilirubin, UA: NEGATIVE
Glucose, UA: NEGATIVE mg/dL
Ketones, POC UA: NEGATIVE mg/dL
Leukocytes, UA: NEGATIVE
Nitrite, UA: NEGATIVE
Protein Ur, POC: NEGATIVE mg/dL
Spec Grav, UA: 1.02 (ref 1.010–1.025)
Urobilinogen, UA: 0.2 E.U./dL
pH, UA: 6.5 (ref 5.0–8.0)

## 2018-03-09 NOTE — Patient Instructions (Addendum)
Preventing Hypertension Hypertension, commonly called high blood pressure, is when the force of blood pumping through the arteries is too strong. Arteries are blood vessels that carry blood from the heart throughout the body. Over time, hypertension can damage the arteries and decrease blood flow to important parts of the body, including the brain, heart, and kidneys. Often, hypertension does not cause symptoms until blood pressure is very high. For this reason, it is important to have your blood pressure checked on a regular basis. Hypertension can often be prevented with diet and lifestyle changes. If you already have hypertension, you can control it with diet and lifestyle changes, as well as medicine. What nutrition changes can be made? Maintain a healthy diet. This includes:  Eating less salt (sodium). Ask your health care provider how much sodium is safe for you to have. The general recommendation is to consume less than 1 tsp (2,300 mg) of sodium a day. ? Do not add salt to your food. ? Choose low-sodium options when grocery shopping and eating out.  Limiting fats in your diet. You can do this by eating low-fat or fat-free dairy products and by eating less red meat.  Eating more fruits, vegetables, and whole grains. Make a goal to eat: ? 1-2 cups of fresh fruits and vegetables each day. ? 3-4 servings of whole grains each day.  Avoiding foods and beverages that have added sugars.  Eating fish that contain healthy fats (omega-3 fatty acids), such as mackerel or salmon.  If you need help putting together a healthy eating plan, try the DASH diet. This diet is high in fruits, vegetables, and whole grains. It is low in sodium, red meat, and added sugars. DASH stands for Dietary Approaches to Stop Hypertension. What lifestyle changes can be made?  Lose weight if you are overweight. Losing just 3?5% of your body weight can help prevent or control hypertension. ? For example, if your present  weight is 200 lb (91 kg), a loss of 3-5% of your weight means losing 6-10 lb (2.7-4.5 kg). ? Ask your health care provider to help you with a diet and exercise plan to safely lose weight.  Get enough exercise. Do at least 150 minutes of moderate-intensity exercise each week. ? You could do this in short exercise sessions several times a day, or you could do longer exercise sessions a few times a week. For example, you could take a brisk 10-minute walk or bike ride, 3 times a day, for 5 days a week.  Find ways to reduce stress, such as exercising, meditating, listening to music, or taking a yoga class. If you need help reducing stress, ask your health care provider.  Do not smoke. This includes e-cigarettes. Chemicals in tobacco and nicotine products raise your blood pressure each time you smoke. If you need help quitting, ask your health care provider.  Avoid alcohol. If you drink alcohol, limit alcohol intake to no more than 1 drink a day for nonpregnant women and 2 drinks a day for men. One drink equals 12 oz of beer, 5 oz of wine, or 1 oz of hard liquor. Why are these changes important? Diet and lifestyle changes can help you prevent hypertension, and they may make you feel better overall and improve your quality of life. If you have hypertension, making these changes will help you control it and help prevent major complications, such as:  Hardening and narrowing of arteries that supply blood to: ? Your heart. This can cause a heart  attack. ? Your brain. This can cause a stroke. ? Your kidneys. This can cause kidney failure.  Stress on your heart muscle, which can cause heart failure.  What can I do to lower my risk?  Work with your health care provider to make a hypertension prevention plan that works for you. Follow your plan and keep all follow-up visits as told by your health care provider.  Learn how to check your blood pressure at home. Make sure that you know your personal target  blood pressure, as told by your health care provider. How is this treated? In addition to diet and lifestyle changes, your health care provider may recommend medicines to help lower your blood pressure. You may need to try a few different medicines to find what works best for you. You also may need to take more than one medicine. Take over-the-counter and prescription medicines only as told by your health care provider. Where to find support: Your health care provider can help you prevent hypertension and help you keep your blood pressure at a healthy level. Your local hospital or your community may also provide support services and prevention programs. The American Heart Association offers an online support network at: https://www.lee.net/ Where to find more information: Learn more about hypertension from:  National Heart, Lung, and Blood Institute: https://www.peterson.org/  Centers for Disease Control and Prevention: AboutHD.co.nz  American Academy of Family Physicians: http://familydoctor.org/familydoctor/en/diseases-conditions/high-blood-pressure.printerview.all.html  Learn more about the DASH diet from:  National Heart, Lung, and Blood Institute: WedMap.it  Contact a health care provider if:  You think you are having a reaction to medicines you have taken.  You have recurrent headaches or feel dizzy.  You have swelling in your ankles.  You have trouble with your vision. Summary  Hypertension often does not cause any symptoms until blood pressure is very high. It is important to get your blood pressure checked regularly.  Diet and lifestyle changes are the most important steps in preventing hypertension.  By keeping your blood pressure in a healthy range, you can prevent complications like heart attack, heart failure, stroke, and kidney failure.  Work with your health  care provider to make a hypertension prevention plan that works for you. This information is not intended to replace advice given to you by your health care provider. Make sure you discuss any questions you have with your health care provider. Document Released: 11/03/2015 Document Revised: 06/29/2016 Document Reviewed: 06/29/2016 Elsevier Interactive Patient Education  2018 [employer].     IF you received an x-ray today, you will receive an invoice from [facility]. Please contact [facility] at [phone] with questions or concerns regarding your invoice.   IF you received labwork today, you will receive an invoice from [facility]. Please contact [facility] at [phone] with questions or concerns regarding your invoice.   Our billing staff will not be able to assist you with questions regarding bills from these companies.  You will be contacted with the lab results as soon as they are available. The fastest way to get your results is to activate your My Chart account. Instructions are located on the last page of this paperwork. If you have not heard from [place] regarding the results in 2 weeks, please contact this office.

## 2018-03-09 NOTE — Progress Notes (Signed)
5/8/20194:40 PM  Melanie Pierce December 30, 1995, 22 y.o. female [id number]  Chief Complaint  Patient presents with  . Pain    radiating pain theat started left side to the back area wiith burning for the past 3 days. Pain scale 7    HPI:   Patient is a 22 y.o. female who presents today for left flank pain  Patient states that 3 days ago had acute onset along left side of body, towards mid axiliary line that would radiate to the back Pain was intermittent, burning, tearing pain Made worse with eating, specially hot fries or hot wings Associated with diarrhea Denies any fever, chills, nausea, vomiting, dysuria, hematuria, rash Pain has completely resolved  Fall Risk  03/09/2018  Falls in the past year? No     Depression screen [facility] 2/9 03/09/2018 12/13/2015  Decreased Interest 0 1  Down, Depressed, Hopeless 0 3  PHQ - 2 Score 0 4  Altered sleeping - 3  Tired, decreased energy - 3  Change in appetite - 3  Feeling bad or failure about yourself  - 3  Trouble concentrating - 3  Moving slowly or fidgety/restless - 0  Suicidal thoughts - 2  PHQ-9 Score - 21  Difficult doing work/chores - Very difficult    Allergies  Allergen Reactions  . Penicillins Hives    Prior to Admission medications   Medication Sig Start Date End Date Taking? Authorizing Provider    Past Medical History:  Diagnosis Date  . ADHD (attention deficit hyperactivity disorder)   . Anxiety   . Depression   . Migraines     History reviewed. No pertinent surgical history.  Social History   Tobacco Use  . Smoking status: Current Every Day Smoker    Types: E-cigarettes    Start date: 10/11/2014  . Smokeless tobacco: Never Used  Substance Use Topics  . Alcohol use: Yes    Alcohol/week: 0.0 oz    Comment: once in a while    Family History  Problem Relation Age of Onset  . Hypertension Mother   . Hypertension Father   . Hypertension Maternal Grandmother   . Depression Maternal  Grandmother   . Diabetes Maternal Grandmother   . Hypertension Maternal Grandfather   . Depression Maternal Grandfather   . Healthy Sister   . Healthy Brother     Review of Systems  Constitutional: Negative for chills, fever, malaise/fatigue and weight loss.  Respiratory: Negative for cough and shortness of breath.   Cardiovascular: Negative for chest pain, palpitations and leg swelling.  Gastrointestinal: Positive for abdominal pain and diarrhea. Negative for blood in stool, constipation, heartburn, melena, nausea and vomiting.  Genitourinary: Negative for dysuria, frequency, hematuria and urgency.     OBJECTIVE:  Blood pressure (!) 140/100, pulse 92, temperature 98.5 F (36.9 C), temperature source Oral, height 5' 5.4" (1.661 m), weight 153 lb 12.8 oz (69.8 kg), last menstrual period 02/09/2018, SpO2 96 %.  BP Readings from Last 3 Encounters:  03/09/18 (!) 140/100  03/26/16 110/68  12/13/15 130/90   Repeat 144/90  Physical Exam  Constitutional: She is oriented to person, place, and time.  HENT:  Head: Normocephalic and atraumatic.  Mouth/Throat: Oropharynx is clear and moist. No oropharyngeal exudate.  Eyes: Pupils are equal, round, and reactive to light. EOM are normal. No scleral icterus.  Neck: Neck supple.  Cardiovascular: Normal rate, regular rhythm and normal heart sounds. Exam reveals no gallop and no friction rub.  No murmur heard. Pulmonary/Chest: Effort normal  and breath sounds normal. She has no wheezes. She has no rales.  Abdominal: Soft. Bowel sounds are normal. She exhibits no mass. There is no hepatosplenomegaly. There is no tenderness. There is no CVA tenderness.  Musculoskeletal: She exhibits no edema.  Neurological: She is alert and oriented to person, place, and time.  Skin: Skin is warm and dry.    Results for orders placed or performed in visit on 03/09/18 (from the past 24 hour(s))  POCT urinalysis dipstick     Status: Abnormal   Collection Time:  03/09/18  4:42 PM  Result Value Ref Range   Color, UA yellow yellow   Clarity, UA cloudy (A) clear   Glucose, UA negative negative mg/dL   Bilirubin, UA negative negative   Ketones, POC UA negative negative mg/dL   Spec Grav, UA 1.610 9.604 - 1.025   Blood, UA trace-lysed (A) negative   pH, UA 6.5 5.0 - 8.0   Protein Ur, POC negative negative mg/dL   Urobilinogen, UA 0.2 0.2 or 1.0 E.U./dL   Nitrite, UA Negative Negative   Leukocytes, UA Negative Negative    ASSESSMENT and PLAN  1. Left flank pain - POCT urinalysis dipstick Unclear etiology, might have been related to recent binge of hot/spicy foods, might have been a kidney stone that has passed, mild case of diverticulitis.etc. At this point it has resolved. If pain returns, will re-evaluate then. RTC precautions reviewed.  2. Blood pressure elevated without history of HTN Elevated BP today in clinic. Recent increase in fast/junk food. Discussed dash diet. Patient with strong fhx HTN/CVD. Recheck at next visit.  - Comprehensive metabolic panel - TSH - CBC  3. Microscopic hematuria - Urine Microscopic  Return in about 2 weeks (around 03/23/2018).    Myles Lipps, MD Primary Care at [facility] [address] [place], [place] [address] Ph.  [phone] Fax [phone]

## 2018-03-10 LAB — COMPREHENSIVE METABOLIC PANEL
ALT: 11 IU/L (ref 0–32)
AST: 12 IU/L (ref 0–40)
Albumin/Globulin Ratio: 1.8 (ref 1.2–2.2)
Albumin: 4.5 g/dL (ref 3.5–5.5)
Alkaline Phosphatase: 57 IU/L (ref 39–117)
BUN/Creatinine Ratio: 9 (ref 9–23)
BUN: 7 mg/dL (ref 6–20)
Bilirubin Total: 0.3 mg/dL (ref 0.0–1.2)
CO2: 23 mmol/L (ref 20–29)
Calcium: 9.7 mg/dL (ref 8.7–10.2)
Chloride: 100 mmol/L (ref 96–106)
Creatinine, Ser: 0.76 mg/dL (ref 0.57–1.00)
GFR calc Af Amer: 129 mL/min/{1.73_m2} (ref >59)
GFR calc non Af Amer: 112 mL/min/{1.73_m2} (ref >59)
Globulin, Total: 2.5 g/dL (ref 1.5–4.5)
Glucose: 77 mg/dL (ref 65–99)
Potassium: 4.1 mmol/L (ref 3.5–5.2)
Sodium: 138 mmol/L (ref 134–144)
Total Protein: 7 g/dL (ref 6.0–8.5)

## 2018-03-10 LAB — URINALYSIS, MICROSCOPIC ONLY: Casts: NONE SEEN /lpf

## 2018-03-10 LAB — CBC
Hematocrit: 40.3 % (ref 34.0–46.6)
Hemoglobin: 13 g/dL (ref 11.1–15.9)
MCH: 29.1 pg (ref 26.6–33.0)
MCHC: 32.3 g/dL (ref 31.5–35.7)
MCV: 90 fL (ref 79–97)
Platelets: 238 10*3/uL (ref 150–379)
RBC: 4.47 x10E6/uL (ref 3.77–5.28)
RDW: 12.9 % (ref 12.3–15.4)
WBC: 8.1 10*3/uL (ref 3.4–10.8)

## 2018-03-10 LAB — TSH: TSH: 1.22 u[IU]/mL (ref 0.450–4.500)

## 2018-03-25 ENCOUNTER — Ambulatory Visit (INDEPENDENT_AMBULATORY_CARE_PROVIDER_SITE_OTHER): Payer: 59 | Admitting: Family Medicine

## 2018-03-25 ENCOUNTER — Other Ambulatory Visit: Payer: Self-pay

## 2018-03-25 ENCOUNTER — Encounter: Payer: Self-pay | Admitting: Family Medicine

## 2018-03-25 VITALS — BP 124/80 | HR 83 | Temp 98.6°F | Resp 18 | Ht 65.4 in | Wt 150.8 lb

## 2018-03-25 DIAGNOSIS — R030 Elevated blood-pressure reading, without diagnosis of hypertension: Secondary | ICD-10-CM

## 2018-03-25 NOTE — Progress Notes (Signed)
   5/24/20195:20 PM  Melanie Pierce 1996-01-27, 22 y.o. female [id number]  Chief Complaint  Patient presents with  . Follow-up    follow up on flank pain    HPI:   Patient is a 22 y.o. female who was found to have elevated BP at last visit who presents today for folllowup on BP and labs  CBC, CMP and TSH normal She reports left flank pain has not returned, she has also been avoiding spicy foods She has no acute concerns today  Fall Risk  03/09/2018  Falls in the past year? No     Depression screen [facility] 2/9 03/09/2018 12/13/2015  Decreased Interest 0 1  Down, Depressed, Hopeless 0 3  PHQ - 2 Score 0 4  Altered sleeping - 3  Tired, decreased energy - 3  Change in appetite - 3  Feeling bad or failure about yourself  - 3  Trouble concentrating - 3  Moving slowly or fidgety/restless - 0  Suicidal thoughts - 2  PHQ-9 Score - 21  Difficult doing work/chores - Very difficult    Allergies  Allergen Reactions  . Penicillins Hives    Prior to Admission medications   Not on File    Past Medical History:  Diagnosis Date  . ADHD (attention deficit hyperactivity disorder)   . Anxiety   . Depression   . Migraines     No past surgical history on file.  Social History   Tobacco Use  . Smoking status: Current Every Day Smoker    Types: E-cigarettes    Start date: 10/11/2014  . Smokeless tobacco: Never Used  Substance Use Topics  . Alcohol use: Yes    Alcohol/week: 0.0 oz    Comment: once in a while    Family History  Problem Relation Age of Onset  . Hypertension Mother   . Hypertension Father   . Hypertension Maternal Grandmother   . Depression Maternal Grandmother   . Diabetes Maternal Grandmother   . Hypertension Maternal Grandfather   . Depression Maternal Grandfather   . Healthy Sister   . Healthy Brother     ROS Per hpi  OBJECTIVE:  Blood pressure 124/80, pulse 83, temperature 98.6 F (37 C), temperature source Oral, resp. rate 18,  height 5' 5.4" (1.661 m), weight 150 lb 12.8 oz (68.4 kg), last menstrual period 03/15/2018, SpO2 97 %.  Physical Exam  Constitutional: She is oriented to person, place, and time.  HENT:  Head: Normocephalic and atraumatic.  Mouth/Throat: Mucous membranes are normal.  Eyes: Pupils are equal, round, and reactive to light. EOM are normal. No scleral icterus.  Neck: Neck supple.  Pulmonary/Chest: Effort normal.  Neurological: She is alert and oriented to person, place, and time.  Skin: Skin is warm and dry.  Psychiatric: She has a normal mood and affect.  Nursing note and vitals reviewed.    ASSESSMENT and PLAN  1. Elevated BP without diagnosis of hypertension Normal reading today. Discussed healthy lifestyle choices.  Return if symptoms worsen or fail to improve.    Myles Lipps, MD Primary Care at [facility] [address] [place], [place] [address] Ph.  [phone] Fax [phone]

## 2018-03-25 NOTE — Patient Instructions (Signed)
     IF you received an x-ray today, you will receive an invoice from [facility]. Please contact [facility] at [phone] with questions or concerns regarding your invoice.   IF you received labwork today, you will receive an invoice from [facility]. Please contact [facility] at [phone] with questions or concerns regarding your invoice.   Our billing staff will not be able to assist you with questions regarding bills from these companies.  You will be contacted with the lab results as soon as they are available. The fastest way to get your results is to activate your My Chart account. Instructions are located on the last page of this paperwork. If you have not heard from us regarding the results in 2 weeks, please contact this office.     

## 2020-01-24 ENCOUNTER — Encounter: Payer: Self-pay | Admitting: Certified Nurse Midwife

## 2022-10-28 ENCOUNTER — Telehealth: Payer: Self-pay

## 2022-10-28 NOTE — Telephone Encounter (Signed)
No working #. AS, CMA 

## 2023-06-15 ENCOUNTER — Ambulatory Visit
Admission: EM | Admit: 2023-06-15 | Discharge: 2023-06-15 | Disposition: A | Discharge status: Home / Self Care | Payer: Medicaid Other | Attending: Physician Assistant | Admitting: Physician Assistant

## 2023-06-15 DIAGNOSIS — K047 Periapical abscess without sinus: Secondary | ICD-10-CM

## 2023-06-15 MED ORDER — CLINDAMYCIN HCL 150 MG PO CAPS: 150.0000 mg | ORAL_CAPSULE | Freq: Four times a day (QID) | ORAL | 0 refills | Status: AC

## 2023-06-15 NOTE — ED Provider Notes (Signed)
[facility]    CSN: [id number] Arrival date & time: 06/15/23  1050      History   Chief Complaint Chief Complaint  Patient presents with   Dental Pain    HPI Melanie Pierce is a 27 y.o. female.   Patient here today for evaluation of right upper sided dental pain that started 2 days ago.  She has had dental issues in this area and states that she has filled out previously.  She has not any fever.  She denies any nausea or vomiting.  The history is provided by the patient.    Past Medical History:  Diagnosis Date   ADHD (attention deficit hyperactivity disorder)    Anxiety    Depression    Migraines     Patient Active Problem List   Diagnosis Date Noted   Depression 12/13/2015   Elevated blood pressure 12/13/2015   Migraines 12/13/2015    History reviewed. No pertinent surgical history.  OB History     Gravida  0   Para  0   Term  0   Preterm  0   AB  0   Living  0      SAB  0   IAB  0   Ectopic  0   Multiple  0   Live Births               Home Medications    Prior to Admission medications   Medication Sig Start Date End Date Taking? Authorizing Provider  clindamycin (CLEOCIN) 150 MG capsule Take 1 capsule (150 mg total) by mouth every 6 (six) hours. 06/15/23  Yes Tomi Bamberger, PA-C    Family History Family History  Problem Relation Age of Onset   Hypertension Mother    Hypertension Father    Hypertension Maternal Grandmother    Depression Maternal Grandmother    Diabetes Maternal Grandmother    Hypertension Maternal Grandfather    Depression Maternal Grandfather    Healthy Sister    Healthy Brother     Social History Social History   Tobacco Use   Smoking status: Every Day    Types: E-cigarettes    Start date: 10/11/2014   Smokeless tobacco: Never  Vaping Use   Vaping status: Never Used  Substance Use Topics   Alcohol use: Yes    Alcohol/week: 0.0 standard drinks of alcohol    Comment:  once in a while   Drug use: No     Allergies   Penicillins   Review of Systems Review of Systems  Constitutional:  Negative for chills and fever.  HENT:  Positive for dental problem. Negative for rhinorrhea.   Eyes:  Negative for discharge and redness.  Respiratory:  Negative for shortness of breath.   Gastrointestinal:  Negative for abdominal pain, nausea and vomiting.     Physical Exam Triage Vital Signs ED Triage Vitals  Encounter Vitals Group     BP      Systolic BP Percentile      Diastolic BP Percentile      Pulse      Resp      Temp      Temp src      SpO2      Weight      Height      Head Circumference      Peak Flow      Pain Score      Pain Loc  Pain Education      Exclude from Growth Chart    No data found.  Updated Vital Signs BP (!) 162/94 (BP Location: Right Arm)   Pulse 76   Temp 98.9 F (37.2 C) (Oral)   Resp 18   Ht 5\' 4"  (1.626 m)   Wt 130 lb (59 kg)   LMP 05/19/2023 (Exact Date)   SpO2 99%   BMI 22.31 kg/m      Physical Exam Vitals and nursing note reviewed.  Constitutional:      General: She is not in acute distress.    Appearance: Normal appearance. She is not ill-appearing.  HENT:     Head: Normocephalic and atraumatic.     Mouth/Throat:     Comments: Diffuse gingival swelling and erythema noted to right upper molars, swelling appreciated to right lateral maxillary area Eyes:     Conjunctiva/sclera: Conjunctivae normal.  Cardiovascular:     Rate and Rhythm: Normal rate.  Pulmonary:     Effort: Pulmonary effort is normal.  Neurological:     Mental Status: She is alert.  Psychiatric:        Mood and Affect: Mood normal.        Behavior: Behavior normal.        Thought Content: Thought content normal.      UC Treatments / Results  Labs (all labs ordered are listed, but only abnormal results are displayed) Labs Reviewed - No data to display  EKG   Radiology No results found.  Procedures Procedures  (including critical care time)  Medications Ordered in UC Medications - No data to display  Initial Impression / Assessment and Plan / UC Course  I have reviewed the triage vital signs and the nursing notes.  Pertinent labs & imaging results that were available during my care of the patient were reviewed by me and considered in my medical decision making (see chart for details).   Clindamycin prescribed for treatment of suspected abscess.  Recommended follow-up with dentist as soon as possible.  Encouraged follow-up sooner with any further concerns or worsening symptoms.  Advised ibuprofen for dental pain if needed.  Final Clinical Impressions(s) / UC Diagnoses   Final diagnoses:  Dental abscess   Discharge Instructions   None    ED Prescriptions     Medication Sig Dispense Auth. Provider   clindamycin (CLEOCIN) 150 MG capsule Take 1 capsule (150 mg total) by mouth every 6 (six) hours. 28 capsule Tomi Bamberger, PA-C      PDMP not reviewed this encounter.   Tomi Bamberger, PA-C 06/15/23 1701

## 2023-06-15 NOTE — ED Triage Notes (Signed)
"  I am having dental pain on upper right side where tooth fell out". No fever.
